# Patient Record
Sex: Male | Born: 1975 | Race: Black or African American | Hispanic: No | Marital: Single | Smoking: Never smoker
Health system: Southern US, Community
[De-identification: ages and names within clinical notes are randomized; demographics above are authoritative.]

---

## 2007-06-23 ENCOUNTER — Emergency Department (HOSPITAL_COMMUNITY): Admission: EM | Admit: 2007-06-23 | Discharge: 2007-06-23 | Payer: Self-pay | Admitting: Emergency Medicine

## 2008-03-17 ENCOUNTER — Emergency Department (HOSPITAL_BASED_OUTPATIENT_CLINIC_OR_DEPARTMENT_OTHER): Admission: EM | Admit: 2008-03-17 | Discharge: 2008-03-17 | Payer: Self-pay | Admitting: Emergency Medicine

## 2008-10-07 ENCOUNTER — Emergency Department (HOSPITAL_BASED_OUTPATIENT_CLINIC_OR_DEPARTMENT_OTHER): Admission: EM | Admit: 2008-10-07 | Discharge: 2008-10-07 | Payer: Self-pay | Admitting: Emergency Medicine

## 2009-06-02 ENCOUNTER — Emergency Department (HOSPITAL_BASED_OUTPATIENT_CLINIC_OR_DEPARTMENT_OTHER): Admission: EM | Admit: 2009-06-02 | Discharge: 2009-06-02 | Payer: Self-pay | Admitting: Emergency Medicine

## 2009-06-02 ENCOUNTER — Ambulatory Visit: Payer: Self-pay | Admitting: Diagnostic Radiology

## 2010-08-14 LAB — CBC
HCT: 47.2 % (ref 39.0–52.0)
Hemoglobin: 15.8 g/dL (ref 13.0–17.0)
RDW: 12.7 % (ref 11.5–15.5)

## 2010-08-14 LAB — DIFFERENTIAL
Eosinophils Absolute: 0 10*3/uL (ref 0.0–0.7)
Eosinophils Relative: 0 % (ref 0–5)
Lymphs Abs: 0.6 10*3/uL — ABNORMAL LOW (ref 0.7–4.0)
Monocytes Absolute: 0.5 10*3/uL (ref 0.1–1.0)
Monocytes Relative: 4 % (ref 3–12)

## 2010-08-14 LAB — URINALYSIS, ROUTINE W REFLEX MICROSCOPIC
Glucose, UA: NEGATIVE mg/dL
Leukocytes, UA: NEGATIVE
Protein, ur: 100 mg/dL — AB
Specific Gravity, Urine: 1.035 — ABNORMAL HIGH (ref 1.005–1.030)
Urobilinogen, UA: 0.2 mg/dL (ref 0.0–1.0)

## 2010-08-14 LAB — COMPREHENSIVE METABOLIC PANEL
ALT: 11 U/L (ref 0–53)
AST: 27 U/L (ref 0–37)
Calcium: 9.4 mg/dL (ref 8.4–10.5)
Sodium: 143 mEq/L (ref 135–145)
Total Protein: 8.8 g/dL — ABNORMAL HIGH (ref 6.0–8.3)

## 2010-08-14 LAB — URINE MICROSCOPIC-ADD ON

## 2010-08-14 LAB — LIPASE, BLOOD: Lipase: 42 U/L (ref 23–300)

## 2011-03-10 ENCOUNTER — Emergency Department (HOSPITAL_COMMUNITY)
Admission: EM | Admit: 2011-03-10 | Discharge: 2011-03-10 | Disposition: A | Discharge status: Home / Self Care | Payer: Self-pay | Attending: Emergency Medicine | Admitting: Emergency Medicine

## 2011-03-10 ENCOUNTER — Emergency Department (HOSPITAL_COMMUNITY): Payer: Self-pay

## 2011-03-10 DIAGNOSIS — M542 Cervicalgia: Secondary | ICD-10-CM | POA: Insufficient documentation

## 2011-03-10 DIAGNOSIS — R221 Localized swelling, mass and lump, neck: Secondary | ICD-10-CM | POA: Insufficient documentation

## 2011-03-10 DIAGNOSIS — J3489 Other specified disorders of nose and nasal sinuses: Secondary | ICD-10-CM | POA: Insufficient documentation

## 2011-03-10 DIAGNOSIS — S01501A Unspecified open wound of lip, initial encounter: Secondary | ICD-10-CM | POA: Insufficient documentation

## 2011-03-10 DIAGNOSIS — R51 Headache: Secondary | ICD-10-CM | POA: Insufficient documentation

## 2011-03-10 DIAGNOSIS — R220 Localized swelling, mass and lump, head: Secondary | ICD-10-CM | POA: Insufficient documentation

## 2011-03-10 DIAGNOSIS — S0280XA Fracture of other specified skull and facial bones, unspecified side, initial encounter for closed fracture: Secondary | ICD-10-CM | POA: Insufficient documentation

## 2011-03-10 DIAGNOSIS — S0120XA Unspecified open wound of nose, initial encounter: Secondary | ICD-10-CM | POA: Insufficient documentation

## 2011-03-10 DIAGNOSIS — M79609 Pain in unspecified limb: Secondary | ICD-10-CM | POA: Insufficient documentation

## 2011-03-10 DIAGNOSIS — S022XXA Fracture of nasal bones, initial encounter for closed fracture: Secondary | ICD-10-CM | POA: Insufficient documentation
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
## 2014-10-30 ENCOUNTER — Emergency Department
Admission: EM | Admit: 2014-10-30 | Discharge: 2014-10-30 | Disposition: A | Discharge status: Home / Self Care | Payer: Self-pay | Attending: Emergency Medicine | Admitting: Emergency Medicine

## 2014-10-30 ENCOUNTER — Encounter: Payer: Self-pay | Admitting: Emergency Medicine

## 2014-10-30 DIAGNOSIS — N341 Nonspecific urethritis: Secondary | ICD-10-CM | POA: Insufficient documentation

## 2014-10-30 LAB — WET PREP, GENITAL
CLUE CELLS WET PREP: NONE SEEN
TRICH WET PREP: NONE SEEN
Yeast Wet Prep HPF POC: NONE SEEN

## 2014-10-30 MED ORDER — AZITHROMYCIN 250 MG PO TABS
500.0000 mg | ORAL_TABLET | Freq: Once | ORAL | Status: AC
  Administered 2014-10-30: 500 mg via ORAL

## 2014-10-30 MED ORDER — AZITHROMYCIN 250 MG PO TABS
ORAL_TABLET | ORAL | Status: AC
  Administered 2014-10-30: 500 mg via ORAL
  Filled 2014-10-30: qty 4

## 2014-10-30 MED ORDER — CEFTRIAXONE SODIUM 1 G IJ SOLR
500.0000 mg | Freq: Once | INTRAMUSCULAR | Status: AC
  Administered 2014-10-30: 500 mg via INTRAMUSCULAR

## 2014-10-30 MED ORDER — CEFTRIAXONE SODIUM 1 G IJ SOLR
INTRAMUSCULAR | Status: AC
Start: 2014-10-30 — End: 2014-10-30
  Administered 2014-10-30: 500 mg via INTRAMUSCULAR
  Filled 2014-10-30: qty 10

## 2014-10-30 NOTE — ED Notes (Signed)
Pt presents to ED with c/o penile d/c x2 days. Pt denies any burning or painful urination, denies any fevers, nausea, vomiting or diarrhea. Pt reports having unprotected sex "a couple days ago". Pt describes the d/c as "thick and yellowish". Pt is A&O, in NAD, calm and cooperative.

## 2014-10-30 NOTE — Discharge Instructions (Signed)
Urethritis °Urethritis is an inflammation of the tube through which urine exits your bladder (urethra).  °CAUSES °Urethritis is often caused by an infection in your urethra. The infection can be viral, like herpes. The infection can also be bacterial, like gonorrhea. °RISK FACTORS °Risk factors of urethritis include: °· Having sex without using a condom. °· Having multiple sexual partners. °· Having poor hygiene. °SIGNS AND SYMPTOMS °Symptoms of urethritis are less noticeable in women than in men. These symptoms include: °· Burning feeling when you urinate (dysuria). °· Discharge from your urethra. °· Blood in your urine (hematuria). °· Urinating more than usual. °DIAGNOSIS  °To confirm a diagnosis of urethritis, your health care provider will do the following: °· Ask about your sexual history. °· Perform a physical exam. °· Have you provide a sample of your urine for lab testing. °· Use a cotton swab to gently collect a sample from your urethra for lab testing. °TREATMENT  °It is important to treat urethritis. Depending on the cause, untreated urethritis may lead to serious genital infections and possibly infertility. Urethritis caused by a bacterial infection is treated with antibiotic medicine. All sexual partners must be treated.  °HOME CARE INSTRUCTIONS °· Do not have sex until the test results are known and treatment is completed, even if your symptoms go away before you finish treatment. °· If you were prescribed an antibiotic, finish it all even if you start to feel better. °SEEK MEDICAL CARE IF:  °· Your symptoms are not improved in 3 days. °· Your symptoms are getting worse. °· You develop abdominal pain or pelvic pain (in women). °· You develop joint pain. °· You have a fever. °SEEK IMMEDIATE MEDICAL CARE IF:  °· You have severe pain in the belly, back, or side. °· You have repeated vomiting. °MAKE SURE YOU: °· Understand these instructions. °· Will watch your condition. °· Will get help right away if you  are not doing well or get worse. °Document Released: 10/17/2000 Document Revised: 09/07/2013 Document Reviewed: 12/22/2012 °[employer]® Patient Information ©2015 [employer]. This information is not intended to replace advice given to you by your health care provider. Make sure you discuss any questions you have with your health care provider. ° °

## 2014-10-30 NOTE — ED Provider Notes (Signed)
CSN: [id number]     Arrival date & time 10/30/14  2141 History   First MD Initiated Contact with Patient 10/30/14 2250     Chief Complaint  Patient presents with  . Penile Discharge    Pt states "I want an STD test" Pt has earphones in during triage and doesn't look up from cell phone throughout triage. Reports penile d/c for several days.     (Consider location/radiation/quality/duration/timing/severity/associated sxs/prior Treatment) HPI  39 year old male presents for evaluation of penile discharge for the last 2 days. He has had sexual intercourse with a new partner and believes that person may have had an STD. He denies any pain or fevers. Pedal discharge has been mild but purulent. No painful urination. Patient has had a history of STD years ago.    History reviewed. No pertinent past medical history. History reviewed. No pertinent past surgical history. History reviewed. No pertinent family history. History  Substance Use Topics  . Smoking status: Never Smoker   . Smokeless tobacco: Not on file  . Alcohol Use: No    Review of Systems  Constitutional: Negative.  Negative for fever, chills, activity change and appetite change.  HENT: Negative for sore throat and trouble swallowing.   Eyes: Negative for pain and discharge.  Respiratory: Negative for cough, chest tightness and shortness of breath.   Cardiovascular: Negative for chest pain.  Gastrointestinal: Negative for nausea, vomiting, abdominal pain, diarrhea and abdominal distention.  Genitourinary: Positive for discharge. Negative for dysuria, penile swelling, scrotal swelling, difficulty urinating, genital sores, penile pain and testicular pain.  Musculoskeletal: Negative for back pain, arthralgias and gait problem.  Skin: Negative for color change and rash.  Neurological: Negative for dizziness and headaches.  Hematological: Negative for adenopathy.  Psychiatric/Behavioral: Negative for behavioral problems and  agitation.      Allergies  Review of patient's allergies indicates no known allergies.  Home Medications   Prior to Admission medications   Not on File   BP 128/68 mmHg  Pulse 74  Temp(Src) 98.2 F (36.8 C) (Oral)  Resp 20  Ht  (1.88 m)  Wt 200 lb (90.719 kg)  BMI 25.67 kg/m2  SpO2 97% Physical Exam  Constitutional: He is oriented to person, place, and time. He appears well-developed and well-nourished.  HENT:  Head: Normocephalic and atraumatic.  Eyes: Conjunctivae and EOM are normal. Right eye exhibits no discharge. Left eye exhibits no discharge.  Neck: Normal range of motion. Neck supple.  Cardiovascular: Normal rate.   Pulmonary/Chest: Effort normal and breath sounds normal. No respiratory distress.  Abdominal: Soft. There is no tenderness.  Genitourinary: Testes normal. Right testis shows no mass, no swelling and no tenderness. Left testis shows no mass, no swelling and no tenderness. Uncircumcised. No phimosis, paraphimosis, penile erythema or penile tenderness. Discharge (white yellowish thick) found.  Neurological: He is alert and oriented to person, place, and time.  Skin: Skin is warm and dry.  Psychiatric: He has a normal mood and affect.    ED Course  Procedures (including critical care time) Labs Review Labs Reviewed  WET PREP, GENITAL - Abnormal; Notable for the following:    WBC, Wet Prep HPF POC FEW (*)    All other components within normal limits  CHLAMYDIA/NGC RT PCR [facility] ONLY)    Imaging Review No results found.   EKG Interpretation None      MDM   Final diagnoses:  Urethritis, nonspecific    39 year old male with 2 day history of penile discharge.  Gonorrhea/chlamydia PCR pending. Due to active discharge, hx of STD, recent new sexual partner, will treat for gonorrhea and chlamydia. Patient treated with ceftriaxone 500 mg IM 1 followed by azithromycin 1000 mg by mouth 1. Patient will avoid sexual intercourse for the  next week. He will make sure that he notifies all partners. Follow-up with the health department in 5-7 days for repeat evaluation.     Evon Slack, PA-C 10/30/14 2318  Darien Ramus, MD 10/31/14 [phone]

## 2014-10-30 NOTE — ED Notes (Signed)
Pt states "I want an STD test" Pt has earphones in during triage and doesn't look up from cell phone throughout triage. Reports penile d/c for several days.

## 2014-10-31 LAB — CHLAMYDIA/NGC RT PCR (ARMC ONLY)
Chlamydia Tr: NOT DETECTED
N GONORRHOEAE: DETECTED — AB

## 2015-07-08 ENCOUNTER — Encounter (HOSPITAL_COMMUNITY): Payer: Self-pay | Admitting: *Deleted

## 2015-07-08 ENCOUNTER — Emergency Department (HOSPITAL_COMMUNITY)
Admission: EM | Admit: 2015-07-08 | Discharge: 2015-07-08 | Disposition: A | Discharge status: Home / Self Care | Payer: Self-pay | Attending: Emergency Medicine | Admitting: Emergency Medicine

## 2015-07-08 DIAGNOSIS — Z202 Contact with and (suspected) exposure to infections with a predominantly sexual mode of transmission: Secondary | ICD-10-CM | POA: Insufficient documentation

## 2015-07-08 MED ORDER — CEFTRIAXONE SODIUM 250 MG IJ SOLR
250.0000 mg | Freq: Once | INTRAMUSCULAR | Status: AC
  Administered 2015-07-08: 250 mg via INTRAMUSCULAR
  Filled 2015-07-08: qty 250

## 2015-07-08 MED ORDER — AZITHROMYCIN 250 MG PO TABS
1000.0000 mg | ORAL_TABLET | Freq: Once | ORAL | Status: AC
  Administered 2015-07-08: 1000 mg via ORAL
  Filled 2015-07-08: qty 4

## 2015-07-08 MED ORDER — STERILE WATER FOR INJECTION IJ SOLN
INTRAMUSCULAR | Status: AC
  Administered 2015-07-08: 10 mL
  Filled 2015-07-08: qty 10

## 2015-07-08 NOTE — Discharge Instructions (Signed)
You were not tested for all STDs today. Your gonorrhea and chlamydia tests are pending- if they are positive, you will receive a phone call. Refrain from sex until you have the results from a full STD screen. Please go to [facility] (Address: [address], [place], [place] [address] Phone: [phone]) or see [facility] (Address: [address] Phone: [phone]) for full STD screening. Return to the emergency room for worsening of symptoms, fever, and vomiting. ° °

## 2015-07-08 NOTE — ED Notes (Signed)
Pt states he was exposed to STD and he wants to be checked for "everything"  Pt is alert and oriented in NAD  Doesn't know what date he was exposed he said
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
## 2015-07-09 LAB — RPR: RPR: NONREACTIVE

## 2015-07-09 LAB — HIV ANTIBODY (ROUTINE TESTING W REFLEX): HIV SCREEN 4TH GENERATION: NONREACTIVE

## 2015-07-11 LAB — GC/CHLAMYDIA PROBE AMP (~~LOC~~) NOT AT ARMC
CHLAMYDIA, DNA PROBE: NEGATIVE
NEISSERIA GONORRHEA: NEGATIVE
